# Patient Record
Sex: Female | Born: 1955 | Race: Black or African American | Hispanic: No | Marital: Single | Smoking: Former smoker
Health system: Southern US, Community
[De-identification: ages and names within clinical notes are randomized; demographics above are authoritative.]

## PROBLEM LIST (undated history)

## (undated) DIAGNOSIS — K219 Gastro-esophageal reflux disease without esophagitis: Secondary | ICD-10-CM

## (undated) DIAGNOSIS — K635 Polyp of colon: Secondary | ICD-10-CM

## (undated) DIAGNOSIS — E785 Hyperlipidemia, unspecified: Secondary | ICD-10-CM

## (undated) DIAGNOSIS — K5792 Diverticulitis of intestine, part unspecified, without perforation or abscess without bleeding: Secondary | ICD-10-CM

## (undated) DIAGNOSIS — T7840XA Allergy, unspecified, initial encounter: Secondary | ICD-10-CM

## (undated) DIAGNOSIS — F419 Anxiety disorder, unspecified: Secondary | ICD-10-CM

## (undated) HISTORY — DX: Allergy, unspecified, initial encounter: T78.40XA

## (undated) HISTORY — DX: Hyperlipidemia, unspecified: E78.5

## (undated) HISTORY — DX: Gastro-esophageal reflux disease without esophagitis: K21.9

## (undated) HISTORY — DX: Diverticulitis of intestine, part unspecified, without perforation or abscess without bleeding: K57.92

## (undated) HISTORY — DX: Polyp of colon: K63.5

## (undated) HISTORY — PX: OTHER SURGICAL HISTORY: SHX169

## (undated) HISTORY — DX: Anxiety disorder, unspecified: F41.9

---

## 2004-02-14 ENCOUNTER — Ambulatory Visit: Payer: Self-pay | Admitting: Family Medicine

## 2004-11-23 ENCOUNTER — Ambulatory Visit: Payer: Self-pay | Admitting: Internal Medicine

## 2010-06-01 ENCOUNTER — Emergency Department: Payer: Self-pay | Admitting: Emergency Medicine

## 2011-01-09 ENCOUNTER — Emergency Department: Payer: Self-pay | Admitting: Emergency Medicine

## 2012-05-05 DIAGNOSIS — E538 Deficiency of other specified B group vitamins: Secondary | ICD-10-CM | POA: Insufficient documentation

## 2012-09-05 ENCOUNTER — Emergency Department: Payer: Self-pay | Admitting: Emergency Medicine

## 2012-09-05 LAB — COMPREHENSIVE METABOLIC PANEL
Albumin: 3.2 g/dL — ABNORMAL LOW (ref 3.4–5.0)
Anion Gap: 4 — ABNORMAL LOW (ref 7–16)
Bilirubin,Total: 0.3 mg/dL (ref 0.2–1.0)
Calcium, Total: 8.6 mg/dL (ref 8.5–10.1)
Chloride: 111 mmol/L — ABNORMAL HIGH (ref 98–107)
Creatinine: 0.8 mg/dL (ref 0.60–1.30)
EGFR (Non-African Amer.): >60
Glucose: 87 mg/dL (ref 65–99)
Osmolality: 281 (ref 275–301)
Potassium: 3.7 mmol/L (ref 3.5–5.1)
SGOT(AST): 26 U/L (ref 15–37)
SGPT (ALT): 22 U/L (ref 12–78)
Total Protein: 5.9 g/dL — ABNORMAL LOW (ref 6.4–8.2)

## 2012-09-05 LAB — URINALYSIS, COMPLETE
Blood: NEGATIVE
Glucose,UR: NEGATIVE mg/dL (ref 0–75)
Ketone: NEGATIVE
Leukocyte Esterase: NEGATIVE
Nitrite: NEGATIVE
Protein: NEGATIVE
RBC,UR: NONE SEEN /HPF (ref 0–5)
Specific Gravity: 1.003 (ref 1.003–1.030)
Squamous Epithelial: <1
WBC UR: <1 /HPF (ref 0–5)

## 2012-09-05 LAB — CBC
HCT: 38.5 % (ref 35.0–47.0)
MCH: 29.2 pg (ref 26.0–34.0)
Platelet: 104 10*3/uL — ABNORMAL LOW (ref 150–440)
RDW: 14.1 % (ref 11.5–14.5)
WBC: 5.2 10*3/uL (ref 3.6–11.0)

## 2012-09-05 LAB — LIPASE, BLOOD: Lipase: 162 U/L (ref 73–393)

## 2012-09-05 LAB — TROPONIN I: Troponin-I: <0.02 ng/mL

## 2013-08-31 ENCOUNTER — Emergency Department: Payer: Self-pay | Admitting: Internal Medicine

## 2013-12-28 DIAGNOSIS — K5730 Diverticulosis of large intestine without perforation or abscess without bleeding: Secondary | ICD-10-CM | POA: Insufficient documentation

## 2013-12-28 LAB — HM HEPATITIS C SCREENING LAB: HM Hepatitis Screen: NEGATIVE

## 2014-03-21 DIAGNOSIS — Z86010 Personal history of colonic polyps: Secondary | ICD-10-CM | POA: Insufficient documentation

## 2014-04-26 DIAGNOSIS — R7303 Prediabetes: Secondary | ICD-10-CM | POA: Insufficient documentation

## 2014-05-11 ENCOUNTER — Ambulatory Visit: Payer: Self-pay | Admitting: Unknown Physician Specialty

## 2014-05-25 DIAGNOSIS — M1712 Unilateral primary osteoarthritis, left knee: Secondary | ICD-10-CM | POA: Insufficient documentation

## 2015-06-14 DIAGNOSIS — E559 Vitamin D deficiency, unspecified: Secondary | ICD-10-CM | POA: Insufficient documentation

## 2015-06-18 LAB — HM HIV SCREENING LAB: HM HIV Screening: NEGATIVE

## 2016-06-17 DIAGNOSIS — E785 Hyperlipidemia, unspecified: Secondary | ICD-10-CM | POA: Insufficient documentation

## 2017-07-29 ENCOUNTER — Ambulatory Visit: Payer: Self-pay | Admitting: Unknown Physician Specialty

## 2017-08-03 ENCOUNTER — Emergency Department: Payer: Self-pay

## 2017-08-03 ENCOUNTER — Emergency Department
Admission: EM | Admit: 2017-08-03 | Discharge: 2017-08-03 | Disposition: A | Discharge status: Home / Self Care | Payer: Self-pay | Attending: Emergency Medicine | Admitting: Emergency Medicine

## 2017-08-03 ENCOUNTER — Other Ambulatory Visit: Payer: Self-pay

## 2017-08-03 DIAGNOSIS — R0789 Other chest pain: Secondary | ICD-10-CM

## 2017-08-03 DIAGNOSIS — N644 Mastodynia: Secondary | ICD-10-CM | POA: Insufficient documentation

## 2017-08-03 LAB — CBC
HEMATOCRIT: 37.2 % (ref 35.0–47.0)
HEMOGLOBIN: 12.6 g/dL (ref 12.0–16.0)
MCH: 29.6 pg (ref 26.0–34.0)
MCHC: 33.8 g/dL (ref 32.0–36.0)
MCV: 87.4 fL (ref 80.0–100.0)
Platelets: 140 10*3/uL — ABNORMAL LOW (ref 150–440)
RBC: 4.26 MIL/uL (ref 3.80–5.20)
RDW: 13.6 % (ref 11.5–14.5)
WBC: 4.9 10*3/uL (ref 3.6–11.0)

## 2017-08-03 LAB — BASIC METABOLIC PANEL
Anion gap: 8 (ref 5–15)
BUN: 15 mg/dL (ref 6–20)
CO2: 23 mmol/L (ref 22–32)
Calcium: 8.8 mg/dL — ABNORMAL LOW (ref 8.9–10.3)
Chloride: 108 mmol/L (ref 101–111)
Creatinine, Ser: 0.91 mg/dL (ref 0.44–1.00)
GFR calc Af Amer: >60 mL/min (ref >60)
GLUCOSE: 103 mg/dL — AB (ref 65–99)
POTASSIUM: 3.7 mmol/L (ref 3.5–5.1)
Sodium: 139 mmol/L (ref 135–145)

## 2017-08-03 LAB — TROPONIN I: Troponin I: <0.03 ng/mL (ref <0.03)

## 2017-08-03 NOTE — ED Provider Notes (Signed)
[facility]   ____________________________________________    I have reviewed the triage vital signs and the nursing notes.   HISTORY  Chief Complaint Chest Pain     HPI Christie Dawson is a [age] y.o. female who presents with complaints of chest pain.  However upon questioning she admits is actually left breast pain.  She notes that she has had pain in her left breast for approximately 2 weeks.  Recently she started to feel some discomfort in her left armpit as well.  Patient reports she has not had a mammogram yet this year but is compliant with her imaging.  Denies shortness of breath.  No fevers chills or cough.  No recent travel calf pain or swelling.  Has not taken anything for this.  She reports her discomfort is worse when she is sleeping on her left side and putting pressure on her left breast.   No past medical history on file.  There are no active problems to display for this patient.     Prior to Admission medications   Not on File     Allergies Aspirin and Septra ds [sulfamethoxazole-trimethoprim]  No family history on file.  Social History Does not drink, occasional smoking  Review of Systems  Constitutional: No fever/chills Eyes: No visual changes.  ENT: No sore throat. Cardiovascular: As described above Respiratory: Denies shortness of breath. Gastrointestinal: No abdominal pain.  No nausea, no vomiting.   Genitourinary: Negative for dysuria. Musculoskeletal: Negative for back pain. Skin: Negative for rash. Neurological: Negative for headaches   ____________________________________________   PHYSICAL EXAM:  VITAL SIGNS: ED Triage Vitals  Enc Vitals Group     BP 08/03/17 0700 133/77     Pulse Rate 08/03/17 0700 67     Resp 08/03/17 0700 16     Temp 08/03/17 0700 98.6 F (37 C)     Temp Source 08/03/17 0700 Oral     SpO2 08/03/17 0700 100 %     Weight 08/03/17 0701 65.8 kg  (145 lb)     Height 08/03/17 0701 1.638 m (5' 4.5")     Head Circumference --      Peak Flow --      Pain Score 08/03/17 0701 8     Pain Loc --      Pain Edu? --      Excl. in GC? --     Constitutional: Alert and oriented. No acute distress. Pleasant and interactive Eyes: Conjunctivae are normal.  . Nose: No congestion/rhinnorhea. Mouth/Throat: Mucous membranes are moist.    Cardiovascular: Normal rate, regular rhythm. Grossly normal heart sounds.  Good peripheral circulation.  Left breast: Cystic nodularity at approximately 1 PM position which elicits tenderness that mimics her pain.  No lymphadenopathy noted in the axilla.  No erythema or evidence of infection of the breast Respiratory: Normal respiratory effort.  No retractions. Lungs CTAB. Gastrointestinal: Soft and nontender. No distention.  No CVA tenderness.  Musculoskeletal: No lower extremity tenderness nor edema.  Warm and well perfused Neurologic:  Normal speech and language. No gross focal neurologic deficits are appreciated.  Skin:  Skin is warm, dry and intact. No rash noted. Psychiatric: Mood and affect are normal. Speech and behavior are normal.  ____________________________________________   LABS (all labs ordered are listed, but only abnormal results are displayed)  Labs Reviewed  BASIC METABOLIC PANEL - Abnormal; Notable for the following components:      Result Value   Glucose,  Bld 103 (*)    Calcium 8.8 (*)    All other components within normal limits  CBC - Abnormal; Notable for the following components:   Platelets 140 (*)    All other components within normal limits  TROPONIN I   ____________________________________________  EKG  ED ECG REPORT I, Jene Every, the attending physician, personally viewed and interpreted this ECG.  Date: 08/03/2017  Rhythm: normal sinus rhythm QRS Axis: normal Intervals: normal ST/T Wave abnormalities: normal Narrative Interpretation: no evidence of acute  ischemia  ____________________________________________  RADIOLOGY  Chest x-ray no pneumonia ____________________________________________   PROCEDURES  Procedure(s) performed: No  Procedures   Critical Care performed: No ____________________________________________   INITIAL IMPRESSION / ASSESSMENT AND PLAN / ED COURSE  Pertinent labs & imaging results that were available during my care of the patient were reviewed by me and considered in my medical decision making (see chart for details).  Patient well-appearing in no acute distress.  She has left breast tenderness with some cystic nodularity noted on exam, we discussed the need for outpatient follow-up regarding this, i.e. mammogram, possible ultrasound.  She reports she can do that this week.  Lab work is reassuring, history not consistent with ACS, troponin EKG normal.    ____________________________________________   FINAL CLINICAL IMPRESSION(S) / ED DIAGNOSES  Final diagnoses:  Atypical chest pain  Breast pain, left        Note:  This document was prepared using Dragon voice recognition software and may include unintentional dictation errors.    Jene Every, MD 08/03/17 1426

## 2017-08-03 NOTE — ED Notes (Signed)
Notified kim, RN of pt in room. Pt placed on CM. In nad at this time. Denies chest pain.

## 2017-08-03 NOTE — ED Triage Notes (Signed)
Pt to triage via wheelchair. Pt reports mid sternal chest pressure and pain intermittently for about 2 weeks. Today worse pain and radiates to her left arm and breast region. No shortness of breath or diaphoresis but occasional nausea with the pain.

## 2018-03-24 DIAGNOSIS — K5904 Chronic idiopathic constipation: Secondary | ICD-10-CM | POA: Insufficient documentation

## 2018-05-13 LAB — HM COLONOSCOPY

## 2018-07-22 DIAGNOSIS — M1711 Unilateral primary osteoarthritis, right knee: Secondary | ICD-10-CM | POA: Insufficient documentation

## 2018-12-06 LAB — HM MAMMOGRAPHY

## 2019-06-29 LAB — HM PAP SMEAR

## 2019-10-14 ENCOUNTER — Ambulatory Visit (INDEPENDENT_AMBULATORY_CARE_PROVIDER_SITE_OTHER): Payer: BC Managed Care – PPO | Admitting: Family Medicine

## 2019-10-14 ENCOUNTER — Other Ambulatory Visit: Payer: Self-pay

## 2019-10-14 ENCOUNTER — Encounter: Payer: Self-pay | Admitting: Family Medicine

## 2019-10-14 VITALS — BP 121/70 | HR 71 | Temp 97.8°F | Resp 16 | Ht 64.5 in | Wt 155.2 lb

## 2019-10-14 DIAGNOSIS — E538 Deficiency of other specified B group vitamins: Secondary | ICD-10-CM

## 2019-10-14 DIAGNOSIS — R5383 Other fatigue: Secondary | ICD-10-CM

## 2019-10-14 DIAGNOSIS — Z8349 Family history of other endocrine, nutritional and metabolic diseases: Secondary | ICD-10-CM

## 2019-10-14 DIAGNOSIS — R7303 Prediabetes: Secondary | ICD-10-CM | POA: Diagnosis not present

## 2019-10-14 DIAGNOSIS — Z780 Asymptomatic menopausal state: Secondary | ICD-10-CM | POA: Insufficient documentation

## 2019-10-14 DIAGNOSIS — K219 Gastro-esophageal reflux disease without esophagitis: Secondary | ICD-10-CM | POA: Insufficient documentation

## 2019-10-14 DIAGNOSIS — E785 Hyperlipidemia, unspecified: Secondary | ICD-10-CM

## 2019-10-14 DIAGNOSIS — Z8619 Personal history of other infectious and parasitic diseases: Secondary | ICD-10-CM

## 2019-10-14 DIAGNOSIS — E559 Vitamin D deficiency, unspecified: Secondary | ICD-10-CM

## 2019-10-14 DIAGNOSIS — D696 Thrombocytopenia, unspecified: Secondary | ICD-10-CM | POA: Diagnosis not present

## 2019-10-14 DIAGNOSIS — I872 Venous insufficiency (chronic) (peripheral): Secondary | ICD-10-CM

## 2019-10-14 NOTE — Assessment & Plan Note (Addendum)
Previously controlled A1c 5.7 to 5.8  Plan:  1. Not on any therapy currently  2. Encourage improved lifestyle - low carb, low sugar diet, reduce portion size, continue improving regular exercise  Future check A1c q 6-12 months

## 2019-10-14 NOTE — Assessment & Plan Note (Signed)
Chronic problem, complication from history of Lyme disease Previous Heme work up, has been stable without sequela Check CBC

## 2019-10-14 NOTE — Assessment & Plan Note (Signed)
Prior problem, causing fatigue and reduced energy Due for lab panel with B12 Was on supplement

## 2019-10-14 NOTE — Assessment & Plan Note (Signed)
Chronic problem since 30s Classic history reviewed per HPI Has been treated at that time 30 years ago Still has chronic fatigue, thought may have some complications of chronic lyme perhaps causing some fatigue

## 2019-10-14 NOTE — Patient Instructions (Addendum)
Thank you for coming to the office today.  Labs today. Will follow up on mychart  Future we can do a Thyroid ultrasound if interested, due to some thyroid fullness but I don't feel any nodules on the thyroid gland today.   Please schedule a Follow-up Appointment to: Return in about 3 months (around 01/14/2020) for 3 month PreDM A1c.  If you have any other questions or concerns, please feel free to call the office or send a message through MyChart. You may also schedule an earlier appointment if necessary.  Additionally, you may be receiving a survey about your experience at our office within a few days to 1 week by e-mail or mail. We value your feedback.  Saralyn Pilar, DO [facility], [place]

## 2019-10-14 NOTE — Assessment & Plan Note (Signed)
Likely etiology of underlying edema episodic lower extremity Exam unremarkable, no obvious varicose veins, some minor spider veins Reassurance RICE therapy Limit sodium Improve hydration Stay active Future offer vascular US imaging if indicated and consider refer - she has strong family history of lymphedema.

## 2019-10-14 NOTE — Progress Notes (Signed)
Subjective:    Patient ID: Christie Dawson, female    DOB: 12-11-1955, 64 y.o.   MRN: [id number]  Christie Dawson is a 64 y.o. female presenting on 10/14/2019 for Establish Care (exhaustion btw sinus pressure onset yesterday but denies fever or chills --as per pt has hx of seasonal allergies), Diarrhea (onset week ), and Joint Swelling (ankles )  Previously followed by [facility] in [place]. She has now changed work to this area and she is here to relocate to new PCP  HPI   Fatigue / Reduced Energy History of Vitamin Deficiency - B12, D History of Lyme Disease 1990s Family history of Thyroid Disease  Reports onset since October 2020 Fatigue, weight gain, exhaustion Fam history of thyroid problems, mother and sisters have had problems with this. One sister with thyroid cancer and breast cancer. Other sisters with surgical removal of thyroid with nodule. Her last lab test 03/31/19 - TSH 0.84, Free T4 0.76  She has regular exercise regimen with walking up to 30 miles per week, some weight training 2 days.  Had COVID testing negative. No confirmed case of COVID. S/p vaccine now  Had atypical chest pains left sided, saw multiple docs and Dr [facility], ruled out cardiac cause. Thought to be related to swelling or lymphedema with s/p lymph node removed L axillary in 1992. Can get swelling episodic under L arm  Admits swelling in both ankles episodic, improve if elevated, some days better  History of Lyme Disease in 1990s Had erythema migrans and flu-like symptoms. Complication with thrombocytopenia Followed by Hematologist in past. She was advised to get it check 1-2 times a year.  HYPERLIPIDEMIA: - Reports no concerns. Last lipid panel 06/2019 - Currently taking supplements not on statin   Health Maintenance:  COVID19 vaccine updated Moderna  Colonoscopy updated 05/13/18, [facility] - results in CareEverywhere with no polyps collected. Previous history of polyps. Advised  to repeat in 5 years, next 04/2023  [facility] Mammogram 12/06/18 - negative, bi rads 1 - repeat yearly  Pap smear 06/29/19 - [facility] - negative, negative for high risk HPV, repeat 3 years.  Negative Hep C screening 12/28/13 - done at [facility], in chart.  Negative HIV screening 06/18/15, done at [facility] in chart  Depression screen [facility] 2/9 10/14/2019  Decreased Interest 0  Down, Depressed, Hopeless 0  PHQ - 2 Score 0   No flowsheet data found.   Past Medical History:  Diagnosis Date  . Allergy   . Anxiety   . Colon polyp   . Diverticulitis   . GERD (gastroesophageal reflux disease)   . Hyperlipidemia    Past Surgical History:  Procedure Laterality Date  . Lymph node removal     Social History   Socioeconomic History  . Marital status: Single    Spouse name: Not on file  . Number of children: Not on file  . Years of education: College  . Highest education level: Not on file  Occupational History  . Not on file  Tobacco Use  . Smoking status: Former Smoker    Packs/day: 1.00    Years: 40.00    Pack years: 40.00    Types: Cigarettes    Quit date: 11/05/2017    Years since quitting: 1.9  . Smokeless tobacco: Former Engineer, water and Sexual Activity  . Alcohol use: Yes    Alcohol/week: 1.0 standard drink    Types: 1 Standard drinks or equivalent per week  . Drug use: Never  .  Sexual activity: Not on file  Other Topics Concern  . Not on file  Social History Narrative  . Not on file   Social Determinants of Health   Financial Resource Strain:   . Difficulty of Paying Living Expenses:   Food Insecurity:   . Worried About Programme researcher, broadcasting/film/video in the Last Year:   . Barista in the Last Year:   Transportation Needs:   . Freight forwarder (Medical):   Marland Kitchen Lack of Transportation (Non-Medical):   Physical Activity:   . Days of Exercise per Week:   . Minutes of Exercise per Session:   Stress:   . Feeling of Stress :   Social Connections:   . Frequency of Communication with  Friends and Family:   . Frequency of Social Gatherings with Friends and Family:   . Attends Religious Services:   . Active Member of Clubs or Organizations:   . Attends Banker Meetings:   Marland Kitchen Marital Status:   Intimate Partner Violence:   . Fear of Current or Ex-Partner:   . Emotionally Abused:   Marland Kitchen Physically Abused:   . Sexually Abused:    Family History  Problem Relation Age of Onset  . Dementia Mother   . Thyroid disease Mother   . Stroke Father   . Diabetes Father   . Cancer Father        pancreatic   Current Outpatient Medications on File Prior to Visit  Medication Sig  . tretinoin (RETIN-A) 0.05 % cream Apply topically at bedtime.  . Turmeric (QC TUMERIC COMPLEX PO) Take by mouth.  . Vitamins-Lipotropics (COMPLEX B-100-INOSITOL) TBCR Take by mouth.  . cyanocobalamin 1000 MCG tablet Take by mouth. (Patient not taking: Reported on 10/14/2019)  . estrogen, conjugated,-medroxyprogesterone (PREMPRO) 0.3-1.5 MG tablet Take 1 tablet by mouth daily. (Patient not taking: Reported on 10/14/2019)   No current facility-administered medications on file prior to visit.    Review of Systems Per HPI unless specifically indicated above      Objective:    BP 121/70   Pulse 71   Temp 97.8 F (36.6 C) (Temporal)   Resp 16   Ht 5' 4.5" (1.638 m)   Wt 155 lb 3.2 oz (70.4 kg)   SpO2 96%   BMI 26.23 kg/m   Wt Readings from Last 3 Encounters:  10/14/19 155 lb 3.2 oz (70.4 kg)  08/03/17 145 lb (65.8 kg)    Physical Exam Vitals and nursing note reviewed.  Constitutional:      General: She is not in acute distress.    Appearance: She is well-developed. She is not diaphoretic.     Comments: Well-appearing, comfortable, cooperative  HENT:     Head: Normocephalic and atraumatic.  Eyes:     General:        Right eye: No discharge.        Left eye: No discharge.     Conjunctiva/sclera: Conjunctivae normal.  Neck:     Thyroid: No thyromegaly.     Comments: Mild thyroid  fullness on palpation bilateral, without distinct nodularity Cardiovascular:     Rate and Rhythm: Normal rate and regular rhythm.     Heart sounds: Normal heart sounds. No murmur heard.   Pulmonary:     Effort: Pulmonary effort is normal. No respiratory distress.     Breath sounds: Normal breath sounds. No wheezing or rales.  Musculoskeletal:        General: Normal range of motion.  Cervical back: Normal range of motion and neck supple.     Right lower leg: Edema (trace edema localized ankles) present.     Left lower leg: Edema (trace edema localized) present.  Lymphadenopathy:     Cervical: No cervical adenopathy.  Skin:    General: Skin is warm and dry.     Findings: No erythema or rash.  Neurological:     Mental Status: She is alert and oriented to person, place, and time.  Psychiatric:        Behavior: Behavior normal.     Comments: Well groomed, good eye contact, normal speech and thoughts    Copy of Colonoscopy  Patient Name: Kestrel Mis      Procedure Date: 05/13/2018 7:51 AM  MRN: [id number]           Date of Birth: [birth year]  Admit Type: Outpatient       Age: 20  Room: [facility] GI PROCEDURES 02    Gender: Female  Note Status: Finalized        Instrument Name: Lenoria Chime [id number]  _______________________________________________________________________________   Procedure:     Colonoscopy  Indications:    High risk colon cancer surveillance: Personal history of           colonic polyps, Last colonoscopy: December 2015  Providers:     MELISSA Gwyndolyn Kaufman, MD, Phillis Haggis, Theodis Sato, Technician  Referring MD:   Laqueta Linden, MD (Referring MD)  Medicines:     Propofol per Anesthesia  Complications:   No immediate complications.  _______________________________________________________________________________  Procedure:     Pre-Anesthesia Assessment:            - Prior to the procedure, a History and Physical was           performed, and patient medications and allergies were           reviewed. The patient's tolerance of previous anesthesia           was also reviewed. The risks and benefits of the procedure           and the sedation options and risks were discussed with the           patient. All questions were answered, and informed consent           was obtained. Prior Anticoagulants: The patient has taken           ibuprofen, last dose was 1 week prior to procedure. ASA           Grade Assessment: II - A patient with mild systemic           disease. After reviewing the risks and benefits, the           patient was deemed in satisfactory condition to undergo           the procedure.           After obtaining informed consent, the scope was passed           under direct vision. Throughout the procedure, the           patient's blood pressure, pulse, and oxygen saturations           were monitored continuously. The Colonoscope was           introduced through the anus and advanced to the 6 cminto  the ileum. The colonoscopy was performed without           difficulty. The patient tolerated the procedure well. The           quality of the bowel preparation was excellent. The           terminal ileum, ileocecal valve, appendiceal orifice, and           rectum were photographed. Scope withdrawal time was 19           minutes.                                          Findings:   The perianal and digital rectal examinations were normal.    The terminal ileum appeared normal.    Retroflexion in the right colon was performed.    Internal hemorrhoids were found during  retroflexion. The hemorrhoids    were Grade I (internal hemorrhoids that do not prolapse).    The exam was otherwise without abnormality.                                          Impression:    - The examined portion of the ileum was normal.          - Internal hemorrhoids.           - No specimens collected.  Recommendation:  - Patient has a contact number available for emergencies.           The signs and symptoms of potential delayed complications          were discussed with the patient. Return to normal           activities tomorrow. Written discharge instructions were           provided to the patient.           - Resume previous diet.           - Continue present medications.           - Repeat colonoscopy in 5 years for surveillance. due to           personal history of polyps           - Discharge patient to home.   Copy of Mammogram  Impression  No mammographic evidence of malignancy. Recommend routine mammography  screening in one year.   The exam was electronically reviewed by a staff physician.   BI-RADS: 1 - Negative Narrative  EXAM:  MAMMO SCREEN BREAST WITH TOMOSYNTHESIS BILATERAL 12/06/2018 8:00 AM   INDICATION:  Screening   COMPARISON:  Compared to: 12/01/2017 Mammo screening breast tomosynthesis bilateral and  07/22/2016 Mammo screening breast tomosynthesis bilateral   TECHNIQUE:  Tomosynthesis images were obtained as part of this exam.   FINDINGS:  The breasts are heterogeneously dense, which may obscure small masses.   There are no suspicious masses, calcifications, or other findings in  either breast. Specimen Collected: 12/06/18 8:07 AM Last Resulted: 12/06/18 1:16 PM  Received From: [facility]  Result Received: 10/14/19 9:46 AM     Last Pap Smear  PAP Test  Liquid-Based Screening-Low Risk(Routine) Specimen:  Genital - Cervix uteri structure (body structure) Component 3 mo ago  Case Report  Gynecologic (Pap  Test)              Case: [id number]                 Authorizing Provider: Lynwood DawleyPeck, Erin Campbell, MD  Collected:      06/29/2019 0800        Ordering Location:   [facility] Received:      06/29/2019 1921                   Care                                     First Screen:     Frances MaywoodYang, Pang, CT(ASCP)                             Specimen:  PAP LIQUID BASED IMAGED SCRN, Cervical/Endocervical                     Diagnostic Interpretation  NEGATIVE FOR INTRAEPITHELIAL LESION OR MALIGNANCY   Electronically signed by Frances MaywoodYang, Pang, CT(ASCP) on 06/30/2019 at 2:22 PM  Comment    High Risk HPV testing is pending.    Specimen Source    Cervical/Endocervical    Statement of Adequacy  Satisfactory for evaluation.     Endocervical Component  Endocervical/transformation zone component is absent.     High Risk Human Papilloma Virus (HPV) With Genotyping Specimen:  Genital - Cervix uteri structure (body structure)  Ref Range & Units 3 mo ago  High Risk HPV Not Detected  Not Detected      High-Risk HPV (NON 16/18) Not Detected, N/A  Not Detected      HPV Type 16 Not Detected, N/A  Not Detected      HPV Type 18 Not Detected, N/A  Not Detected      Results for orders placed or performed during the hospital encounter of 08/03/17  Basic metabolic panel  Result Value Ref Range   Sodium 139 135 - 145 mmol/L   Potassium 3.7 3.5 - 5.1 mmol/L   Chloride 108 101 - 111 mmol/L   CO2 23 22 - 32 mmol/L   Glucose, Bld 103 (H) 65 - 99 mg/dL   BUN 15 6 - 20 mg/dL   Creatinine, Ser 0.980.91 0.44 - 1.00 mg/dL   Calcium 8.8 (L) 8.9 - 10.3 mg/dL   GFR calc non Af Amer >60 >60 mL/min   GFR calc Af  Amer >60 >60 mL/min   Anion gap 8 5 - 15  CBC  Result Value Ref Range   WBC 4.9 3.6 - 11.0 K/uL   RBC 4.26 3.80 - 5.20 MIL/uL   Hemoglobin 12.6 12.0 - 16.0 g/dL   HCT 11.937.2 35 - 47 %   MCV 87.4 80.0 - 100.0 fL   MCH 29.6 26.0 - 34.0 pg   MCHC 33.8 32.0 - 36.0 g/dL   RDW 14.713.6 82.911.5 - 56.214.5 %   Platelets 140 (L) 150 - 440 K/uL  Troponin I  Result Value Ref Range   Troponin I <0.03 <0.03 ng/mL      Assessment & Plan:   Problem List Items Addressed This Visit    Vitamin D deficiency   Relevant Orders   VITAMIN D 25 Hydroxy (Vit-D Deficiency, Fractures)   Venous insufficiency of both lower extremities    Likely etiology of  underlying edema episodic lower extremity Exam unremarkable, no obvious varicose veins, some minor spider veins Reassurance RICE therapy Limit sodium Improve hydration Stay active Future offer vascular US imaging if indicated and consider refer - she has strong family history of lymphedema.      Relevant Orders   COMPLETE METABOLIC PANEL WITH GFR   Thrombocytopenia (HCC) - Primary    Chronic problem, complication from history of Lyme disease Previous Heme work up, has been stable without sequela Check CBC      Relevant Orders   CBC with Differential/Platelet   COMPLETE METABOLIC PANEL WITH GFR   Pre-diabetes    Previously controlled A1c 5.7 to 5.8  Plan:  1. Not on any therapy currently  2. Encourage improved lifestyle - low carb, low sugar diet, reduce portion size, continue improving regular exercise  Future check A1c q 6-12 months      Relevant Orders   COMPLETE METABOLIC PANEL WITH GFR   Hyperlipidemia LDL goal <100    Controlled cholesterol on lifestyle supplements Last lipid panel 06/2019  Plan: Encourage improved lifestyle - low carb/cholesterol, reduce portion size, continue improving regular exercise      Relevant Orders   Thyroid Panel With TSH   History of Lyme disease    Chronic problem since 1990s Classic history reviewed  per HPI Has been treated at that time 30 years ago Still has chronic fatigue, thought may have some complications of chronic lyme perhaps causing some fatigue      B12 deficiency    Prior problem, causing fatigue and reduced energy Due for lab panel with B12 Was on supplement      Relevant Orders   Vitamin B12    Other Visit Diagnoses    Family history of thyroid disease       Relevant Orders   Thyroid Panel With TSH   Fatigue, unspecified type       Relevant Orders   Thyroid Panel With TSH      #Fam history Thyroid / Thyroid Fullness Constellation of symptoms including fatigue, has other causes, but given strong fam history will order Thyroid panel today among other labs - Offer future surveillance with neck soft tissue US given some palpable thyroid fullness today  Updated chart from [facility] prior outside records.   Orders Placed This Encounter  Procedures  . Vitamin B12  . VITAMIN D 25 Hydroxy (Vit-D Deficiency, Fractures)  . Thyroid Panel With TSH  . CBC with Differential/Platelet  . COMPLETE METABOLIC PANEL WITH GFR     No orders of the defined types were placed in this encounter.    Follow up plan: Return in about 3 months (around 01/14/2020) for 3 month PreDM A1c.  Saralyn Pilar, DO [facility] 10/14/2019, 10:45 AM

## 2019-10-14 NOTE — Assessment & Plan Note (Addendum)
Controlled cholesterol on lifestyle supplements Last lipid panel 06/2019  Plan: Encourage improved lifestyle - low carb/cholesterol, reduce portion size, continue improving regular exercise

## 2019-10-15 LAB — COMPLETE METABOLIC PANEL WITH GFR
AG Ratio: 2 (calc) (ref 1.0–2.5)
ALT: 13 U/L (ref 6–29)
AST: 18 U/L (ref 10–35)
Albumin: 4.2 g/dL (ref 3.6–5.1)
Alkaline phosphatase (APISO): 79 U/L (ref 37–153)
BUN: 8 mg/dL (ref 7–25)
CO2: 29 mmol/L (ref 20–32)
Calcium: 9.6 mg/dL (ref 8.6–10.4)
Chloride: 106 mmol/L (ref 98–110)
Creat: 0.75 mg/dL (ref 0.50–0.99)
GFR, Est African American: 99 mL/min/{1.73_m2} (ref >=60)
GFR, Est Non African American: 85 mL/min/{1.73_m2} (ref >=60)
Globulin: 2.1 g/dL (calc) (ref 1.9–3.7)
Glucose, Bld: 96 mg/dL (ref 65–99)
Potassium: 4.4 mmol/L (ref 3.5–5.3)
Sodium: 142 mmol/L (ref 135–146)
Total Bilirubin: 0.5 mg/dL (ref 0.2–1.2)
Total Protein: 6.3 g/dL (ref 6.1–8.1)

## 2019-10-15 LAB — CBC WITH DIFFERENTIAL/PLATELET
Absolute Monocytes: 373 cells/uL (ref 200–950)
Basophils Absolute: 41 cells/uL (ref 0–200)
Basophils Relative: 1 %
Eosinophils Absolute: 41 cells/uL (ref 15–500)
Eosinophils Relative: 1 %
HCT: 39.8 % (ref 35.0–45.0)
Hemoglobin: 12.4 g/dL (ref 11.7–15.5)
Lymphs Abs: 1984 cells/uL (ref 850–3900)
MCH: 27.1 pg (ref 27.0–33.0)
MCHC: 31.2 g/dL — ABNORMAL LOW (ref 32.0–36.0)
MCV: 86.9 fL (ref 80.0–100.0)
MPV: 14.3 fL — ABNORMAL HIGH (ref 7.5–12.5)
Monocytes Relative: 9.1 %
Neutro Abs: 1661 cells/uL (ref 1500–7800)
Neutrophils Relative %: 40.5 %
Platelets: 138 10*3/uL — ABNORMAL LOW (ref 140–400)
RBC: 4.58 10*6/uL (ref 3.80–5.10)
RDW: 12.7 % (ref 11.0–15.0)
Total Lymphocyte: 48.4 %
WBC: 4.1 10*3/uL (ref 3.8–10.8)

## 2019-10-15 LAB — VITAMIN B12: Vitamin B-12: 861 pg/mL (ref 200–1100)

## 2019-10-15 LAB — THYROID PANEL WITH TSH
Free Thyroxine Index: 2.1 (ref 1.4–3.8)
T3 Uptake: 28 % (ref 22–35)
T4, Total: 7.6 ug/dL (ref 5.1–11.9)
TSH: 2.57 mIU/L (ref 0.40–4.50)

## 2019-10-15 LAB — VITAMIN D 25 HYDROXY (VIT D DEFICIENCY, FRACTURES): Vit D, 25-Hydroxy: 22 ng/mL — ABNORMAL LOW (ref 30–100)

## 2020-01-13 ENCOUNTER — Ambulatory Visit: Payer: BC Managed Care – PPO | Admitting: Family Medicine

## 2020-01-19 ENCOUNTER — Ambulatory Visit: Payer: BC Managed Care – PPO | Admitting: Family Medicine

## 2021-04-19 DIAGNOSIS — R310 Gross hematuria: Secondary | ICD-10-CM | POA: Diagnosis not present

## 2022-07-08 ENCOUNTER — Other Ambulatory Visit: Payer: Self-pay

## 2022-07-08 DIAGNOSIS — I10 Essential (primary) hypertension: Secondary | ICD-10-CM

## 2022-07-11 ENCOUNTER — Ambulatory Visit
Admission: RE | Admit: 2022-07-11 | Discharge: 2022-07-11 | Disposition: A | Discharge status: Home / Self Care | Payer: Self-pay | Source: Ambulatory Visit | Attending: Internal Medicine | Admitting: Internal Medicine

## 2022-07-11 DIAGNOSIS — I10 Essential (primary) hypertension: Secondary | ICD-10-CM | POA: Insufficient documentation
